# Patient Record
Sex: Male | Born: 1955 | Race: White | Marital: Married | Smoking: Current every day smoker
Health system: Southern US, Community
[De-identification: ages and names within clinical notes are randomized; demographics above are authoritative.]

## PROBLEM LIST (undated history)

## (undated) DIAGNOSIS — F1010 Alcohol abuse, uncomplicated: Secondary | ICD-10-CM

## (undated) HISTORY — PX: KIDNEY SURGERY: SHX687

## (undated) HISTORY — PX: HERNIA REPAIR: SHX51

---

## 2019-06-17 ENCOUNTER — Ambulatory Visit: Payer: Self-pay | Attending: Internal Medicine

## 2019-06-17 DIAGNOSIS — Z23 Encounter for immunization: Secondary | ICD-10-CM | POA: Insufficient documentation

## 2019-06-17 NOTE — Progress Notes (Signed)
   Covid-19 Vaccination Clinic  Name:  Cody Griffith    MRN: [id number] DOB: 1955/06/20  06/17/2019  Mr. Cody Griffith was observed post Covid-19 immunization for 15 minutes without incident. He was provided with Vaccine Information Sheet and instruction to access the V-Safe system.   Mr. Cody Griffith was instructed to call 911 with any severe reactions post vaccine: Marland Kitchen Difficulty breathing  . Swelling of face and throat  . A fast heartbeat  . A bad rash all over body  . Dizziness and weakness   Immunizations Administered    Name Date Dose VIS Date Route   Moderna COVID-19 Vaccine 06/17/2019 12:04 PM 0.5 mL 03/14/2019 Intramuscular   Manufacturer: Moderna   Lot: [id number]   NDC: 16606-004-59

## 2019-07-19 ENCOUNTER — Ambulatory Visit: Payer: Self-pay | Attending: Internal Medicine

## 2019-07-19 DIAGNOSIS — Z23 Encounter for immunization: Secondary | ICD-10-CM

## 2019-07-19 NOTE — Progress Notes (Signed)
   Covid-19 Vaccination Clinic  Name:  Cody Griffith    MRN: [id number] DOB: [birth year]  07/19/2019  Cody Griffith was observed post Covid-19 immunization for 15 minutes without incident. He was provided with Vaccine Information Sheet and instruction to access the V-Safe system.   Cody Griffith was instructed to call 911 with any severe reactions post vaccine: Marland Kitchen Difficulty breathing  . Swelling of face and throat  . A fast heartbeat  . A bad rash all over body  . Dizziness and weakness   Immunizations Administered    Name Date Dose VIS Date Route   Moderna COVID-19 Vaccine 07/19/2019  2:35 PM 0.5 mL 03/14/2019 Intramuscular   Manufacturer: Gala Murdoch   Lot: [id number]   NDC: 68372-902-11

## 2020-12-07 ENCOUNTER — Emergency Department (HOSPITAL_BASED_OUTPATIENT_CLINIC_OR_DEPARTMENT_OTHER): Payer: 59

## 2020-12-07 ENCOUNTER — Emergency Department (HOSPITAL_BASED_OUTPATIENT_CLINIC_OR_DEPARTMENT_OTHER)
Admission: EM | Admit: 2020-12-07 | Discharge: 2020-12-07 | Disposition: A | Discharge status: Home / Self Care | Payer: 59 | Attending: Emergency Medicine | Admitting: Emergency Medicine

## 2020-12-07 ENCOUNTER — Other Ambulatory Visit: Payer: Self-pay

## 2020-12-07 ENCOUNTER — Encounter (HOSPITAL_BASED_OUTPATIENT_CLINIC_OR_DEPARTMENT_OTHER): Payer: Self-pay | Admitting: Emergency Medicine

## 2020-12-07 DIAGNOSIS — F17210 Nicotine dependence, cigarettes, uncomplicated: Secondary | ICD-10-CM | POA: Insufficient documentation

## 2020-12-07 DIAGNOSIS — R1012 Left upper quadrant pain: Secondary | ICD-10-CM | POA: Diagnosis present

## 2020-12-07 DIAGNOSIS — R61 Generalized hyperhidrosis: Secondary | ICD-10-CM | POA: Diagnosis not present

## 2020-12-07 HISTORY — DX: Alcohol abuse, uncomplicated: F10.10

## 2020-12-07 LAB — COMPREHENSIVE METABOLIC PANEL
ALT: 23 U/L (ref 0–44)
AST: 17 U/L (ref 15–41)
Albumin: 3.9 g/dL (ref 3.5–5.0)
Alkaline Phosphatase: 63 U/L (ref 38–126)
Anion gap: 7 (ref 5–15)
BUN: 10 mg/dL (ref 8–23)
CO2: 23 mmol/L (ref 22–32)
Calcium: 8.7 mg/dL — ABNORMAL LOW (ref 8.9–10.3)
Chloride: 108 mmol/L (ref 98–111)
Creatinine, Ser: 1.07 mg/dL (ref 0.61–1.24)
GFR, Estimated: >60 mL/min (ref >60)
Glucose, Bld: 91 mg/dL (ref 70–99)
Potassium: 4.2 mmol/L (ref 3.5–5.1)
Sodium: 138 mmol/L (ref 135–145)
Total Bilirubin: 0.4 mg/dL (ref 0.3–1.2)
Total Protein: 7.1 g/dL (ref 6.5–8.1)

## 2020-12-07 LAB — CBC WITH DIFFERENTIAL/PLATELET
Abs Immature Granulocytes: 0.04 10*3/uL (ref 0.00–0.07)
Basophils Absolute: 0.1 10*3/uL (ref 0.0–0.1)
Basophils Relative: 1 %
Eosinophils Absolute: 0.2 10*3/uL (ref 0.0–0.5)
Eosinophils Relative: 2 %
HCT: 43.8 % (ref 39.0–52.0)
Hemoglobin: 15 g/dL (ref 13.0–17.0)
Immature Granulocytes: 0 %
Lymphocytes Relative: 26 %
Lymphs Abs: 2.7 10*3/uL (ref 0.7–4.0)
MCH: 32 pg (ref 26.0–34.0)
MCHC: 34.2 g/dL (ref 30.0–36.0)
MCV: 93.4 fL (ref 80.0–100.0)
Monocytes Absolute: 0.7 10*3/uL (ref 0.1–1.0)
Monocytes Relative: 7 %
Neutro Abs: 6.6 10*3/uL (ref 1.7–7.7)
Neutrophils Relative %: 64 %
Platelets: 270 10*3/uL (ref 150–400)
RBC: 4.69 MIL/uL (ref 4.22–5.81)
RDW: 12.3 % (ref 11.5–15.5)
WBC: 10.2 10*3/uL (ref 4.0–10.5)
nRBC: 0 % (ref 0.0–0.2)

## 2020-12-07 LAB — SEDIMENTATION RATE: Sed Rate: 5 mm/hr (ref 0–16)

## 2020-12-07 LAB — C-REACTIVE PROTEIN: CRP: 0.6 mg/dL (ref <1.0)

## 2020-12-07 LAB — LIPASE, BLOOD: Lipase: 50 U/L (ref 11–51)

## 2020-12-07 MED ORDER — SODIUM CHLORIDE 0.9 % IV BOLUS
1000.0000 mL | Freq: Once | INTRAVENOUS | Status: AC
  Administered 2020-12-07: 1000 mL via INTRAVENOUS

## 2020-12-07 MED ORDER — PANTOPRAZOLE SODIUM 40 MG PO TBEC: 40.0000 mg | DELAYED_RELEASE_TABLET | Freq: Two times a day (BID) | ORAL | 1 refills | Status: AC

## 2020-12-07 MED ORDER — PANTOPRAZOLE SODIUM 40 MG IV SOLR
40.0000 mg | Freq: Once | INTRAVENOUS | Status: AC
  Administered 2020-12-07: 40 mg via INTRAVENOUS
  Filled 2020-12-07: qty 40

## 2020-12-07 MED ORDER — HYDROCODONE-ACETAMINOPHEN 5-325 MG PO TABS: 1.0000 | ORAL_TABLET | Freq: Four times a day (QID) | ORAL | 0 refills | Status: AC | PRN

## 2020-12-07 MED ORDER — SUCRALFATE 1 GM/10ML PO SUSP: 1.0000 g | Freq: Three times a day (TID) | ORAL | 0 refills | Status: AC

## 2020-12-07 MED ORDER — IOHEXOL 350 MG/ML SOLN
100.0000 mL | Freq: Once | INTRAVENOUS | Status: AC | PRN
  Administered 2020-12-07: 100 mL via INTRAVENOUS

## 2020-12-07 MED ORDER — LIDOCAINE VISCOUS HCL 2 % MT SOLN
15.0000 mL | Freq: Once | OROMUCOSAL | Status: AC
  Administered 2020-12-07: 15 mL via ORAL
  Filled 2020-12-07: qty 15

## 2020-12-07 MED ORDER — ONDANSETRON HCL 4 MG/2ML IJ SOLN
4.0000 mg | Freq: Once | INTRAMUSCULAR | Status: AC
Start: 2020-12-07 — End: 2020-12-07
  Administered 2020-12-07: 4 mg via INTRAVENOUS
  Filled 2020-12-07: qty 2

## 2020-12-07 MED ORDER — MORPHINE SULFATE (PF) 4 MG/ML IV SOLN
4.0000 mg | Freq: Once | INTRAVENOUS | Status: AC
Start: 2020-12-07 — End: 2020-12-07
  Administered 2020-12-07: 4 mg via INTRAVENOUS
  Filled 2020-12-07: qty 1

## 2020-12-07 MED ORDER — ALUM & MAG HYDROXIDE-SIMETH 200-200-20 MG/5ML PO SUSP
30.0000 mL | Freq: Once | ORAL | Status: AC
  Administered 2020-12-07: 30 mL via ORAL
  Filled 2020-12-07: qty 30

## 2020-12-07 NOTE — ED Notes (Signed)
Pt to CT

## 2020-12-07 NOTE — ED Triage Notes (Signed)
Pt c/o left sided rib pain ongoing for 1 week. Pt denies any other symptoms or injuries.

## 2020-12-07 NOTE — ED Provider Notes (Signed)
[facility] EM[facility] DEPART[facility] Provider Note   CSN: [id number] Arrival date & time: 12/07/20  1343     History Chief Complaint  Patient presents with   Abdominal Pain    Cody Griffith is a 65 y.o. male.  Cody Griffith is a 65 y.o. male with a history of alcohol abuse, otherwise healthy, who presents to the emergency department for evaluation of left upper quadrant abdominal pain.  Patient reports symptoms have been present for the past week.  He reports pain is a constant dull ache that is made worse after eating and at night.  He reports that night he has a severe constant pain and some associated night sweats.  He denies any associated nausea or vomiting.  No diarrhea, constipation or change in stools.  No melena or hematochezia.  No associated dysuria, urinary frequency or flank pain.  No associated chest pain or shortness of breath.  He denies NSAID use.  Does report that he has been under a lot of stress recently and about 5 weeks ago stopped using alcohol, was on Antabuse for a few weeks but stopped this because he reports it made him feel sick.  No prior history of ulcers, reports prior kidney surgery when he was a child and previous hernia repair.  The history is provided by the patient.      Past Medical History:  Diagnosis Date   Alcohol abuse     There are no problems to display for this patient.   Past Surgical History:  Procedure Laterality Date   HERNIA REPAIR     KIDNEY SURGERY         No family history on file.  Social History   Tobacco Use   Smoking status: Every Day    Packs/day: 0.50    Types: Cigarettes   Smokeless tobacco: Never  Vaping Use   Vaping Use: Never used  Substance Use Topics   Alcohol use: Not Currently    Comment: stopped in July 2022   Drug use: Not Currently    Home Medications Prior to Admission medications   Medication Sig Start Date End Date Taking? Authorizing Provider  HYDROcodone-acetaminophen (NORCO)  5-325 MG tablet Take 1 tablet by mouth every 6 (six) hours as needed. 12/07/20  Yes Jacqlyn Larsen, PA-C  pantoprazole (PROTONIX) 40 MG tablet Take 1 tablet (40 mg total) by mouth 2 (two) times daily before a meal. 12/07/20  Yes Jacqlyn Larsen, PA-C  sucralfate (CARAFATE) 1 GM/10ML suspension Take 10 mLs (1 g total) by mouth 4 (four) times daily -  with meals and at bedtime. 12/07/20  Yes Jacqlyn Larsen, PA-C    Allergies    Penicillins and Sulfa antibiotics  Review of Systems   Review of Systems  Constitutional:  Negative for chills and fever.  HENT: Negative.    Respiratory:  Negative for cough and shortness of breath.   Cardiovascular:  Negative for chest pain.  Gastrointestinal:  Positive for abdominal pain. Negative for constipation, diarrhea, nausea and vomiting.  Genitourinary:  Negative for dysuria.  Musculoskeletal:  Negative for arthralgias.  Skin:  Negative for color change and rash.  Neurological:  Negative for dizziness, syncope and light-headedness.  All other systems reviewed and are negative.  Physical Exam Updated Vital Signs BP (!) 143/86 (BP Location: Left Arm)   Pulse (!) 52   Temp 98.1 F (36.7 C) (Oral)   Resp 16   Ht 5' 7.5" (1.715 m)   Wt 74.8 kg  SpO2 100%   BMI 25.46 kg/m   Physical Exam Vitals and nursing note reviewed.  Constitutional:      General: He is not in acute distress.    Appearance: Normal appearance. He is well-developed and normal weight. He is not ill-appearing or diaphoretic.  HENT:     Head: Normocephalic and atraumatic.  Eyes:     General:        Right eye: No discharge.        Left eye: No discharge.     Pupils: Pupils are equal, round, and reactive to light.  Cardiovascular:     Rate and Rhythm: Normal rate and regular rhythm.     Pulses: Normal pulses.     Heart sounds: Normal heart sounds.  Pulmonary:     Effort: Pulmonary effort is normal. No respiratory distress.     Breath sounds: Normal breath sounds. No wheezing or  rales.     Comments: Respirations equal and unlabored, patient able to speak in full sentences, lungs clear to auscultation bilaterally  Abdominal:     General: Bowel sounds are normal. There is no distension.     Palpations: Abdomen is soft. There is no mass.     Tenderness: There is abdominal tenderness in the epigastric area and left upper quadrant. There is no guarding.     Comments: Abdomen is soft, nondistended, bowel sounds present throughout, there is focal tenderness in the left upper quadrant and epigastric region, all other quadrants nontender to palpation, no guarding or peritoneal signs, no CVA tenderness  Musculoskeletal:        General: No deformity.     Cervical back: Neck supple.  Skin:    General: Skin is warm and dry.     Capillary Refill: Capillary refill takes less than 2 seconds.  Neurological:     Mental Status: He is alert and oriented to person, place, and time.     Coordination: Coordination normal.     Comments: Speech is clear, able to follow commands Moves extremities without ataxia, coordination intact  Psychiatric:        Mood and Affect: Mood normal.        Behavior: Behavior normal.    ED Results / Procedures / Treatments   Labs (all labs ordered are listed, but only abnormal results are displayed) Labs Reviewed  COMPREHENSIVE METABOLIC PANEL - Abnormal; Notable for the following components:      Result Value   Calcium 8.7 (*)    All other components within normal limits  LIPASE, BLOOD  CBC WITH DIFFERENTIAL/PLATELET  SEDIMENTATION RATE  C-REACTIVE PROTEIN    EKG EKG Interpretation  Date/Time:  Saturday December 07 2020 17:15:16 EDT Ventricular Rate:  50 PR Interval:  146 QRS Duration: 100 QT Interval:  436 QTC Calculation: 398 R Axis:   -48 Text Interpretation: Sinus rhythm LAD, consider left anterior fascicular block RSR' in V1 or V2, right VCD or RVH No prior for comparison No acute ischemia Confirmed by Lorre Munroe ([id number]) on 12/07/2020  5:31:34 PM Also confirmed by Lorre Munroe ([id number]), editor Stetler, Angela ([id number])  on 12/08/2020 10:51:47 AM  Radiology CT ABDOMEN PELVIS W CONTRAST  Result Date: 12/07/2020 CLINICAL DATA:  LUQ abdominal pain EXAM: CT ABDOMEN AND PELVIS WITH CONTRAST TECHNIQUE: Multidetector CT imaging of the abdomen and pelvis was performed using the standard protocol following bolus administration of intravenous contrast. CONTRAST:  162m OMNIPAQUE IOHEXOL 350 MG/ML SOLN COMPARISON:  None. FINDINGS: Lower chest: There is endobronchial debris within  a LEFT lower lobe bronchus. There is downstream centrilobular nodularity. There is a small area of ground-glass nodularity of the RIGHT lower lobe. Hepatobiliary: Gallbladder is unremarkable. Liver is unremarkable for arterialized phase of contrast. No extrahepatic biliary ductal dilation. Pancreas: Unremarkable. No pancreatic ductal dilatation or surrounding inflammatory changes. Spleen: Splenule.  Spleen is otherwise unremarkable. Adrenals/Urinary Tract: Adrenal glands are unremarkable. No hydronephrosis. Favored LEFT-sided extrarenal pelvis versus parapelvic cyst versus sequela of ureteral stricture. Kidneys enhance symmetrically. Subcentimeter hypodense lesion of the inferior pole the RIGHT kidney is too small to accurately characterize. No obstructing nephrolithiasis. Bladder is unremarkable. Stomach/Bowel: Stomach is within normal limits. Appendix is normal in size with high density material in the tip. No evidence of bowel wall thickening, distention, or inflammatory changes. Vascular/Lymphatic: Atherosclerotic calcifications of the aorta. There is mild circumferential wall thickening of the proximal celiac with mild adjacent fat stranding (series 5, image 51; series 2, image 17). No suspicious lymphadenopathy. Reproductive: Prostate is unremarkable. Other: Fat containing LEFT inguinal hernia. No free air or free fluid. 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 IMPRESSION: 1. There is circumferential wall thickening and adjacent fat stranding of the proximal celiac artery. Findings are nonspecific but could reflect vasculitis in the appropriate clinical setting. Recommend correlation with clinical history and laboratory values. 2. LEFT greater than RIGHT basilar centrilobular nodularity and endobronchial debris. Findings are likely infectious or inflammatory in etiology. 3. 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 Aortic Atherosclerosis (ICD10-I70.0). Electronically Signed   By: Valentino Saxon M.D.   On: 12/07/2020 19:14   DG Abd Acute W/Chest  Result Date: 12/07/2020 CLINICAL DATA:  Acute left upper quadrant abdominal pain. EXAM: DG ABDOMEN ACUTE WITH 1 VIEW CHEST COMPARISON:  None. FINDINGS: There is no evidence of dilated bowel loops or free intraperitoneal air. No radiopaque calculi or other significant radiographic abnormality is seen. Heart size and mediastinal contours are within normal limits. Both lungs are clear. IMPRESSION: Negative abdominal radiographs.  No acute cardiopulmonary disease. Electronically Signed   By: Marijo Conception M.D.   On: 12/07/2020 16:55     Procedures Procedures   Medications Ordered in ED Medications  pantoprazole (PROTONIX) injection 40 mg (40 mg Intravenous Given 12/07/20 1721)  alum & mag hydroxide-simeth (MAALOX/MYLANTA) 200-200-20 MG/5ML suspension 30 mL (30 mLs Oral Given 12/07/20 1720)    And  lidocaine (XYLOCAINE) 2 % viscous mouth solution 15 mL (15 mLs Oral Given 12/07/20 1720)  sodium chloride 0.9 % bolus 1,000 mL (0 mLs Intravenous Stopped 12/07/20 2119)  ondansetron (ZOFRAN) injection 4 mg (4 mg Intravenous Given 12/07/20 1900)  morphine 4 MG/ML injection 4 mg (4 mg Intravenous Given 12/07/20 1901)  iohexol (OMNIPAQUE) 350 MG/ML injection 100 mL (100 mLs Intravenous Contrast Given 12/07/20 1844)    ED Course  I have reviewed the triage  vital signs and the nursing notes.  Pertinent labs & imaging results that were available during my care of the patient were reviewed by me and considered in my medical decision making (see chart for details).    MDM Rules/Calculators/A&P                          Patient presents to the ED with complaints of abdominal pain. Patient nontoxic appearing, in no apparent distress, vitals WNL . On exam patient tender to palpation in the left upper quadrant and epigastric regions, no peritoneal signs. Will evaluate with labs and acute  abdominal x-ray.  High clinical suspicion for potential peptic ulcer disease, gastritis, pancreatitis, cholecystitis, gastroenteritis.  Also considered possible diverticulitis, appendicitis, colitis, nephrolithiasis or urinary tract infection but feel these are much less likely given location of pain.  Pain initially treated with Protonix and GI cocktail.  Additional history obtained:  Additional history obtained from chart review & nursing note review.   Lab Tests:  I Ordered, reviewed, and interpreted labs, which included:  CBC: No leukocytosis, normal hemoglobin CMP: No significant electrolyte derangements, normal renal and liver function Lipase: WNL ESR and CRP are not elevated  Imaging Studies ordered:  I ordered imaging studies which included acute abdominal series and CT abdomen pelvis, I independently reviewed, formal radiology impression shows:  Acute abdominal series without acute abnormality, despite reassuring x-ray and lab work patient continues to have pain unrelieved with Protonix and GI cocktail, will proceed with CT  CT abdomen pelvis with circumferential wall thickening and adjacent fat stranding of the proximal celiac artery, this is a nonspecific finding, but could reflect vasculitis in the appropriate clinical setting.  Patient with no known history of vasculitis and no other vascular inflammatory findings on CT.  There is also left greater than  right basilar central lobar nodularity and endobronchial debris, this could be infectious or inflammatory, patient has no associated cough, shortness of breath, and no white blood cell count or findings to suggest pneumonia or infectious respiratory disease.  I discussed CT findings surrounding the celiac artery with Dr. Michail Sermon with GI, he reports these are nonspecific and does not recommend any further emergent work-up in the emergency department.  Recommends continued supportive treatment and outpatient follow-up with PCP and GI.  ED Course:   RE-EVAL: After additional pain control and nausea medication patient's symptoms are well controlled.  I still have concern for potential peptic ulcer disease.  On repeat abdominal exam patient remains without peritoneal signs, low suspicion for cholecystitis, pancreatitis, diverticulitis, appendicitis, bowel obstruction/perforation, or other acute surgical process. Patient tolerating PO in the emergency department. Will discharge home with supportive measures, with PPI, Carafate and short course of pain medication. I discussed results, treatment plan, need for PCP/GI follow-up, and return precautions with the patient. Provided opportunity for questions, patient confirmed understanding and is in agreement with plan.    Portions of this note were generated with [employer]. Dictation errors may occur despite best attempts at proofreading.     Final Clinical Impression(s) / ED Diagnoses Final diagnoses:  Left upper quadrant abdominal pain    Rx / DC Orders ED Discharge Orders          Ordered    pantoprazole (PROTONIX) 40 MG tablet  2 times daily before meals        12/07/20 2109    sucralfate (CARAFATE) 1 GM/10ML suspension  3 times daily with meals & bedtime        12/07/20 2109    HYDROcodone-acetaminophen (NORCO) 5-325 MG tablet  Every 6 hours PRN        12/07/20 2109             Jacqlyn Larsen, PA-C 12/11/20 2321     Arnaldo Natal, MD 12/12/20 [phone]

## 2020-12-07 NOTE — Discharge Instructions (Addendum)
Your evaluation today was overall reassuring.  We did see some nonspecific inflammation around your celiac artery on your CT scan.  Symptoms may also due to a peptic ulcer.  To treat symptoms please take Protonix twice daily before breakfast and dinner, I would also like for you to use Carafate before each meal and before bedtime.  You can use Maalox as needed for breakthrough symptoms.  You can use prescribed hydrocodone for severe pain, use this medication with caution, it can cause drowsiness.  I would like for you to follow-up with your primary care doctor and GI doctor regarding this pain.  If you have worsening pain, vomiting, blood in your stool or dark black stools you should return to the emergency department for reevaluation.

## 2021-06-09 DIAGNOSIS — Z136 Encounter for screening for cardiovascular disorders: Secondary | ICD-10-CM | POA: Diagnosis not present

## 2021-06-09 DIAGNOSIS — Z23 Encounter for immunization: Secondary | ICD-10-CM | POA: Diagnosis not present

## 2021-06-09 DIAGNOSIS — Z13220 Encounter for screening for lipoid disorders: Secondary | ICD-10-CM | POA: Diagnosis not present

## 2021-06-09 DIAGNOSIS — I700 Atherosclerosis of aorta: Secondary | ICD-10-CM | POA: Diagnosis not present

## 2021-06-09 DIAGNOSIS — R69 Illness, unspecified: Secondary | ICD-10-CM | POA: Diagnosis not present

## 2021-06-09 DIAGNOSIS — Z0000 Encounter for general adult medical examination without abnormal findings: Secondary | ICD-10-CM | POA: Diagnosis not present

## 2021-06-09 DIAGNOSIS — Z131 Encounter for screening for diabetes mellitus: Secondary | ICD-10-CM | POA: Diagnosis not present

## 2021-09-18 DIAGNOSIS — H524 Presbyopia: Secondary | ICD-10-CM | POA: Diagnosis not present

## 2022-05-25 DIAGNOSIS — L2089 Other atopic dermatitis: Secondary | ICD-10-CM | POA: Diagnosis not present

## 2022-05-25 DIAGNOSIS — L820 Inflamed seborrheic keratosis: Secondary | ICD-10-CM | POA: Diagnosis not present

## 2022-05-25 DIAGNOSIS — L821 Other seborrheic keratosis: Secondary | ICD-10-CM | POA: Diagnosis not present

## 2022-06-10 DIAGNOSIS — Z131 Encounter for screening for diabetes mellitus: Secondary | ICD-10-CM | POA: Diagnosis not present

## 2022-06-10 DIAGNOSIS — N529 Male erectile dysfunction, unspecified: Secondary | ICD-10-CM | POA: Diagnosis not present

## 2022-06-10 DIAGNOSIS — F17200 Nicotine dependence, unspecified, uncomplicated: Secondary | ICD-10-CM | POA: Diagnosis not present

## 2022-06-10 DIAGNOSIS — R0609 Other forms of dyspnea: Secondary | ICD-10-CM | POA: Diagnosis not present

## 2022-06-10 DIAGNOSIS — I700 Atherosclerosis of aorta: Secondary | ICD-10-CM | POA: Diagnosis not present

## 2022-06-10 DIAGNOSIS — Z0000 Encounter for general adult medical examination without abnormal findings: Secondary | ICD-10-CM | POA: Diagnosis not present

## 2022-06-10 DIAGNOSIS — E7800 Pure hypercholesterolemia, unspecified: Secondary | ICD-10-CM | POA: Diagnosis not present

## 2022-07-22 DIAGNOSIS — C44311 Basal cell carcinoma of skin of nose: Secondary | ICD-10-CM | POA: Diagnosis not present

## 2022-07-22 DIAGNOSIS — D485 Neoplasm of uncertain behavior of skin: Secondary | ICD-10-CM | POA: Diagnosis not present

## 2022-07-22 DIAGNOSIS — L2089 Other atopic dermatitis: Secondary | ICD-10-CM | POA: Diagnosis not present

## 2022-07-22 DIAGNOSIS — C4401 Basal cell carcinoma of skin of lip: Secondary | ICD-10-CM | POA: Diagnosis not present

## 2022-08-11 DIAGNOSIS — E7800 Pure hypercholesterolemia, unspecified: Secondary | ICD-10-CM | POA: Diagnosis not present

## 2022-08-26 DIAGNOSIS — C44311 Basal cell carcinoma of skin of nose: Secondary | ICD-10-CM | POA: Diagnosis not present

## 2022-09-03 DIAGNOSIS — C4401 Basal cell carcinoma of skin of lip: Secondary | ICD-10-CM | POA: Diagnosis not present

## 2022-09-03 DIAGNOSIS — C44311 Basal cell carcinoma of skin of nose: Secondary | ICD-10-CM | POA: Diagnosis not present

## 2022-09-10 DIAGNOSIS — L905 Scar conditions and fibrosis of skin: Secondary | ICD-10-CM | POA: Diagnosis not present

## 2022-09-21 DIAGNOSIS — H2513 Age-related nuclear cataract, bilateral: Secondary | ICD-10-CM | POA: Diagnosis not present

## 2022-10-29 DIAGNOSIS — L905 Scar conditions and fibrosis of skin: Secondary | ICD-10-CM | POA: Diagnosis not present

## 2023-01-27 DIAGNOSIS — W908XXS Exposure to other nonionizing radiation, sequela: Secondary | ICD-10-CM | POA: Diagnosis not present

## 2023-01-27 DIAGNOSIS — L821 Other seborrheic keratosis: Secondary | ICD-10-CM | POA: Diagnosis not present

## 2023-01-27 DIAGNOSIS — L570 Actinic keratosis: Secondary | ICD-10-CM | POA: Diagnosis not present

## 2023-01-27 DIAGNOSIS — L578 Other skin changes due to chronic exposure to nonionizing radiation: Secondary | ICD-10-CM | POA: Diagnosis not present

## 2023-01-27 DIAGNOSIS — Z85828 Personal history of other malignant neoplasm of skin: Secondary | ICD-10-CM | POA: Diagnosis not present

## 2023-02-03 DIAGNOSIS — F17200 Nicotine dependence, unspecified, uncomplicated: Secondary | ICD-10-CM | POA: Diagnosis not present

## 2023-02-03 DIAGNOSIS — I700 Atherosclerosis of aorta: Secondary | ICD-10-CM | POA: Diagnosis not present

## 2023-02-03 DIAGNOSIS — Z79899 Other long term (current) drug therapy: Secondary | ICD-10-CM | POA: Diagnosis not present

## 2023-02-03 DIAGNOSIS — E7800 Pure hypercholesterolemia, unspecified: Secondary | ICD-10-CM | POA: Diagnosis not present

## 2023-02-03 DIAGNOSIS — N529 Male erectile dysfunction, unspecified: Secondary | ICD-10-CM | POA: Diagnosis not present

## 2023-03-22 DIAGNOSIS — H52209 Unspecified astigmatism, unspecified eye: Secondary | ICD-10-CM | POA: Diagnosis not present

## 2023-03-22 DIAGNOSIS — H524 Presbyopia: Secondary | ICD-10-CM | POA: Diagnosis not present

## 2023-03-23 IMAGING — DX DG ABDOMEN ACUTE W/ 1V CHEST
4 series · 4 of 4 positions shown · non-contrast
Comparison: None.

CLINICAL DATA: Acute left upper quadrant abdominal pain.

EXAM:
DG ABDOMEN ACUTE WITH 1 VIEW CHEST

[chest pa]
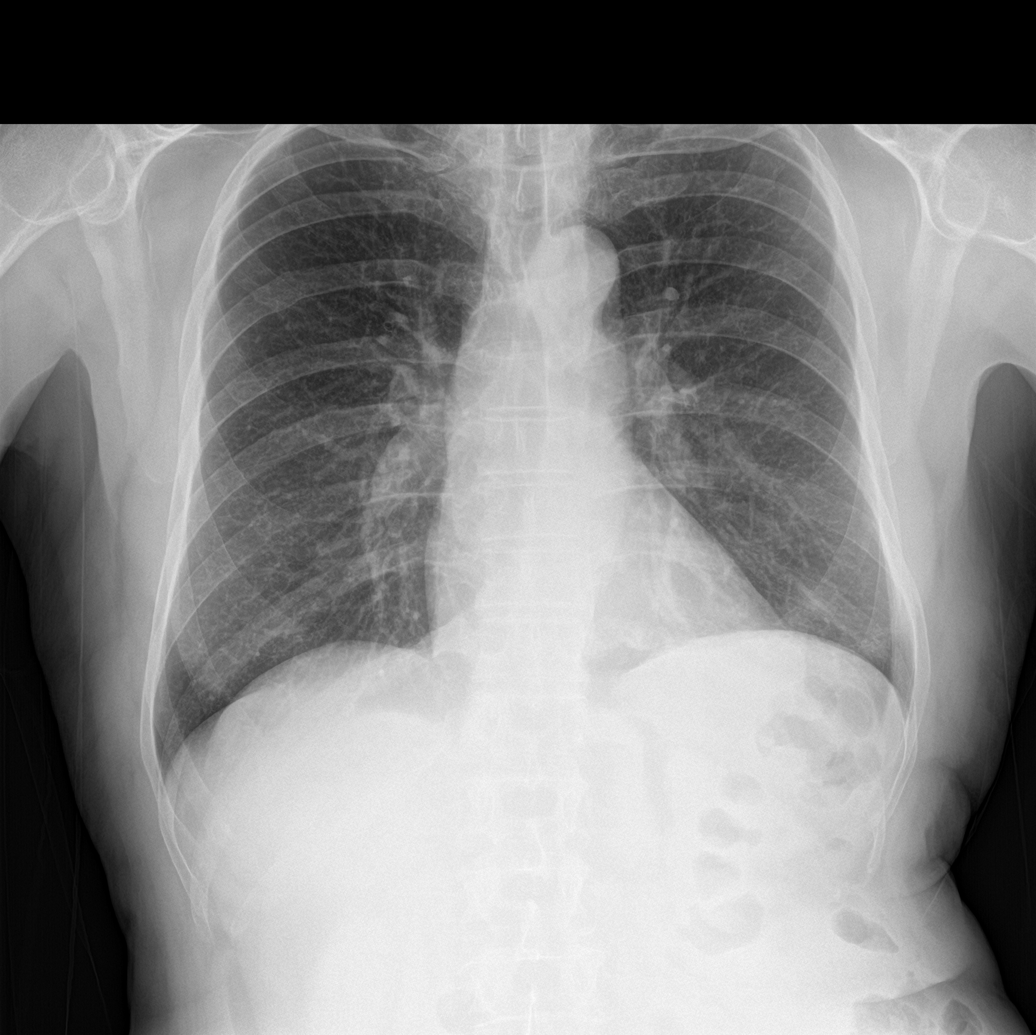

[abdomen erect]
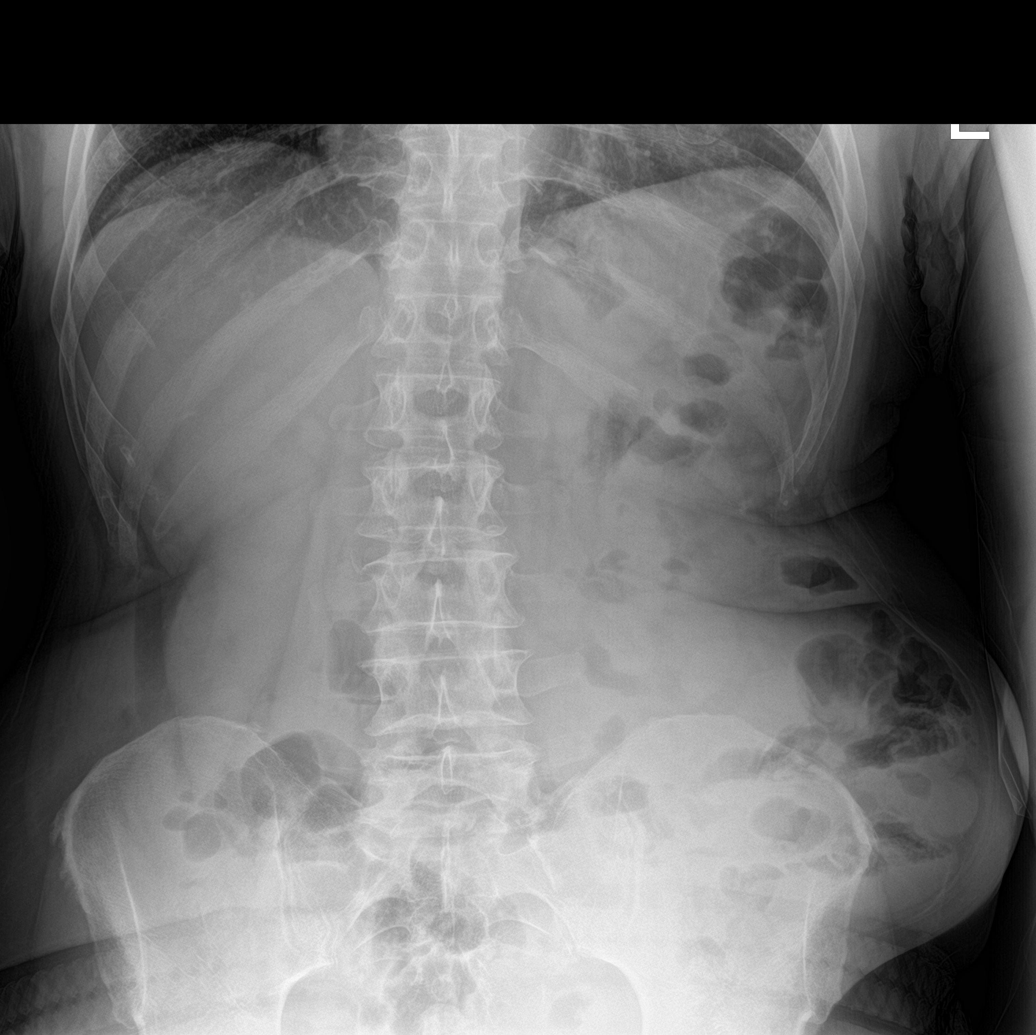

[abdomen supine (1 of 2)]
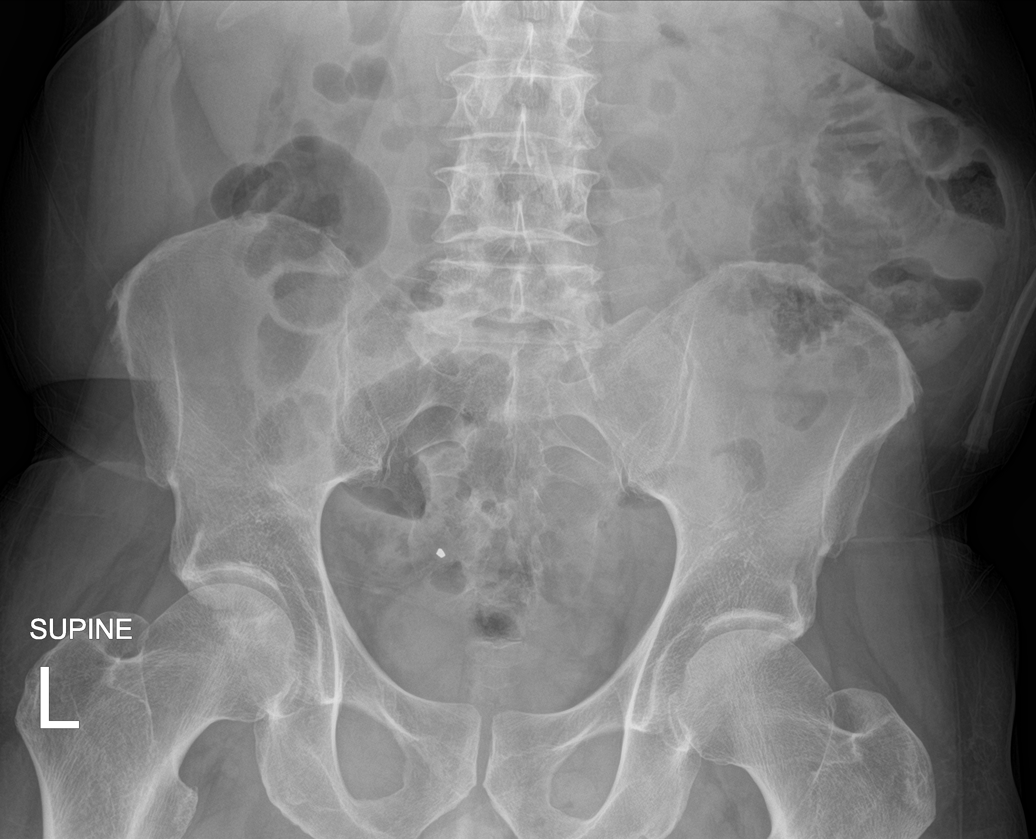

[abdomen supine (2 of 2)]
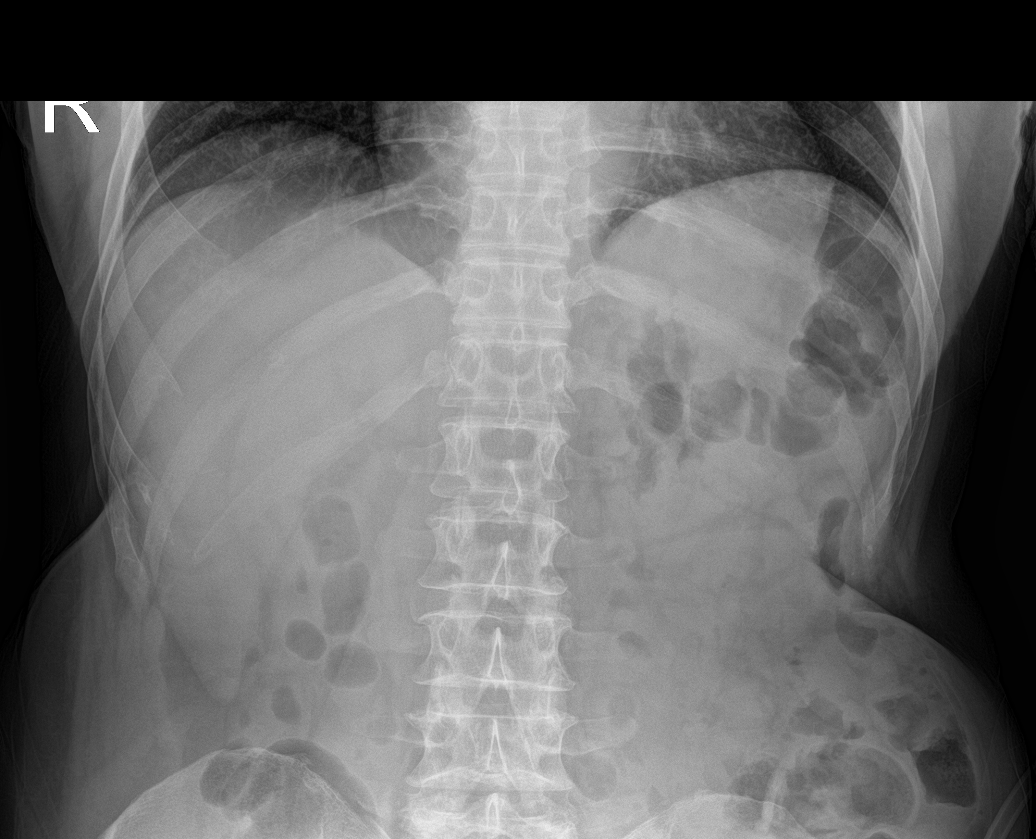

[4 of 4 positions shown; findings below may reference images not displayed]

FINDINGS: There is no evidence of dilated bowel loops or free intraperitoneal
air. No radiopaque calculi or other significant radiographic
abnormality is seen. Heart size and mediastinal contours are within
normal limits. Both lungs are clear.
IMPRESSION: Negative abdominal radiographs.  No acute cardiopulmonary disease.

## 2023-08-11 DIAGNOSIS — D225 Melanocytic nevi of trunk: Secondary | ICD-10-CM | POA: Diagnosis not present

## 2023-08-11 DIAGNOSIS — L820 Inflamed seborrheic keratosis: Secondary | ICD-10-CM | POA: Diagnosis not present

## 2023-08-11 DIAGNOSIS — L821 Other seborrheic keratosis: Secondary | ICD-10-CM | POA: Diagnosis not present

## 2023-08-11 DIAGNOSIS — L570 Actinic keratosis: Secondary | ICD-10-CM | POA: Diagnosis not present

## 2023-08-11 DIAGNOSIS — Z129 Encounter for screening for malignant neoplasm, site unspecified: Secondary | ICD-10-CM | POA: Diagnosis not present

## 2023-08-11 DIAGNOSIS — Z85828 Personal history of other malignant neoplasm of skin: Secondary | ICD-10-CM | POA: Diagnosis not present

## 2023-08-12 DIAGNOSIS — E7800 Pure hypercholesterolemia, unspecified: Secondary | ICD-10-CM | POA: Diagnosis not present

## 2023-08-12 DIAGNOSIS — Z131 Encounter for screening for diabetes mellitus: Secondary | ICD-10-CM | POA: Diagnosis not present

## 2023-08-12 DIAGNOSIS — Z79899 Other long term (current) drug therapy: Secondary | ICD-10-CM | POA: Diagnosis not present

## 2023-08-12 DIAGNOSIS — I700 Atherosclerosis of aorta: Secondary | ICD-10-CM | POA: Diagnosis not present

## 2023-08-12 DIAGNOSIS — F17200 Nicotine dependence, unspecified, uncomplicated: Secondary | ICD-10-CM | POA: Diagnosis not present

## 2023-08-12 DIAGNOSIS — N529 Male erectile dysfunction, unspecified: Secondary | ICD-10-CM | POA: Diagnosis not present

## 2023-08-12 DIAGNOSIS — Z6826 Body mass index (BMI) 26.0-26.9, adult: Secondary | ICD-10-CM | POA: Diagnosis not present

## 2023-10-12 DIAGNOSIS — F17200 Nicotine dependence, unspecified, uncomplicated: Secondary | ICD-10-CM | POA: Diagnosis not present

## 2023-10-12 DIAGNOSIS — Z6825 Body mass index (BMI) 25.0-25.9, adult: Secondary | ICD-10-CM | POA: Diagnosis not present

## 2023-10-12 DIAGNOSIS — Z1331 Encounter for screening for depression: Secondary | ICD-10-CM | POA: Diagnosis not present

## 2023-10-12 DIAGNOSIS — Z1211 Encounter for screening for malignant neoplasm of colon: Secondary | ICD-10-CM | POA: Diagnosis not present

## 2023-10-12 DIAGNOSIS — Z0000 Encounter for general adult medical examination without abnormal findings: Secondary | ICD-10-CM | POA: Diagnosis not present

## 2023-10-27 DIAGNOSIS — H524 Presbyopia: Secondary | ICD-10-CM | POA: Diagnosis not present

## 2023-10-27 DIAGNOSIS — H2513 Age-related nuclear cataract, bilateral: Secondary | ICD-10-CM | POA: Diagnosis not present

## 2024-02-07 DIAGNOSIS — D122 Benign neoplasm of ascending colon: Secondary | ICD-10-CM | POA: Diagnosis not present

## 2024-02-07 DIAGNOSIS — Z09 Encounter for follow-up examination after completed treatment for conditions other than malignant neoplasm: Secondary | ICD-10-CM | POA: Diagnosis not present

## 2024-02-07 DIAGNOSIS — K648 Other hemorrhoids: Secondary | ICD-10-CM | POA: Diagnosis not present

## 2024-02-07 DIAGNOSIS — Z860101 Personal history of adenomatous and serrated colon polyps: Secondary | ICD-10-CM | POA: Diagnosis not present

## 2024-02-09 DIAGNOSIS — W908XXS Exposure to other nonionizing radiation, sequela: Secondary | ICD-10-CM | POA: Diagnosis not present

## 2024-02-09 DIAGNOSIS — D122 Benign neoplasm of ascending colon: Secondary | ICD-10-CM | POA: Diagnosis not present

## 2024-02-09 DIAGNOSIS — Z129 Encounter for screening for malignant neoplasm, site unspecified: Secondary | ICD-10-CM | POA: Diagnosis not present

## 2024-02-09 DIAGNOSIS — Z85828 Personal history of other malignant neoplasm of skin: Secondary | ICD-10-CM | POA: Diagnosis not present

## 2024-02-09 DIAGNOSIS — L578 Other skin changes due to chronic exposure to nonionizing radiation: Secondary | ICD-10-CM | POA: Diagnosis not present

## 2024-02-09 DIAGNOSIS — L821 Other seborrheic keratosis: Secondary | ICD-10-CM | POA: Diagnosis not present

## 2024-02-09 DIAGNOSIS — D225 Melanocytic nevi of trunk: Secondary | ICD-10-CM | POA: Diagnosis not present
